# Patient Record
Sex: Male | Born: 1950 | Race: Black or African American | Hispanic: No | Marital: Married | Smoking: Never smoker
Health system: Southern US, Community
[De-identification: ages and names within clinical notes are randomized; demographics above are authoritative.]

## PROBLEM LIST (undated history)

## (undated) DIAGNOSIS — D332 Benign neoplasm of brain, unspecified: Secondary | ICD-10-CM

## (undated) DIAGNOSIS — G839 Paralytic syndrome, unspecified: Secondary | ICD-10-CM

---

## 2020-06-28 ENCOUNTER — Emergency Department (HOSPITAL_COMMUNITY): Payer: Medicare Other

## 2020-06-28 ENCOUNTER — Other Ambulatory Visit: Payer: Self-pay

## 2020-06-28 ENCOUNTER — Observation Stay (HOSPITAL_COMMUNITY)
Admission: EM | Admit: 2020-06-28 | Discharge: 2020-06-28 | Disposition: A | Discharge status: Home / Self Care | Payer: Medicare Other | Attending: Family Medicine | Admitting: Family Medicine

## 2020-06-28 ENCOUNTER — Encounter (HOSPITAL_COMMUNITY): Payer: Self-pay

## 2020-06-28 DIAGNOSIS — Z8673 Personal history of transient ischemic attack (TIA), and cerebral infarction without residual deficits: Secondary | ICD-10-CM | POA: Diagnosis not present

## 2020-06-28 DIAGNOSIS — Z7982 Long term (current) use of aspirin: Secondary | ICD-10-CM | POA: Insufficient documentation

## 2020-06-28 DIAGNOSIS — E041 Nontoxic single thyroid nodule: Secondary | ICD-10-CM | POA: Diagnosis not present

## 2020-06-28 DIAGNOSIS — E669 Obesity, unspecified: Secondary | ICD-10-CM | POA: Diagnosis present

## 2020-06-28 DIAGNOSIS — G459 Transient cerebral ischemic attack, unspecified: Secondary | ICD-10-CM | POA: Diagnosis present

## 2020-06-28 DIAGNOSIS — R29810 Facial weakness: Secondary | ICD-10-CM | POA: Diagnosis not present

## 2020-06-28 DIAGNOSIS — Z86011 Personal history of benign neoplasm of the brain: Secondary | ICD-10-CM | POA: Diagnosis not present

## 2020-06-28 DIAGNOSIS — R4781 Slurred speech: Secondary | ICD-10-CM | POA: Diagnosis not present

## 2020-06-28 DIAGNOSIS — M952 Other acquired deformity of head: Secondary | ICD-10-CM | POA: Diagnosis present

## 2020-06-28 DIAGNOSIS — E119 Type 2 diabetes mellitus without complications: Secondary | ICD-10-CM

## 2020-06-28 DIAGNOSIS — R55 Syncope and collapse: Secondary | ICD-10-CM

## 2020-06-28 DIAGNOSIS — Z20822 Contact with and (suspected) exposure to covid-19: Secondary | ICD-10-CM | POA: Diagnosis not present

## 2020-06-28 DIAGNOSIS — I10 Essential (primary) hypertension: Secondary | ICD-10-CM | POA: Diagnosis not present

## 2020-06-28 DIAGNOSIS — Z7984 Long term (current) use of oral hypoglycemic drugs: Secondary | ICD-10-CM | POA: Diagnosis not present

## 2020-06-28 DIAGNOSIS — N400 Enlarged prostate without lower urinary tract symptoms: Secondary | ICD-10-CM | POA: Diagnosis present

## 2020-06-28 DIAGNOSIS — Z79899 Other long term (current) drug therapy: Secondary | ICD-10-CM | POA: Diagnosis not present

## 2020-06-28 DIAGNOSIS — N529 Male erectile dysfunction, unspecified: Secondary | ICD-10-CM | POA: Diagnosis present

## 2020-06-28 DIAGNOSIS — I6529 Occlusion and stenosis of unspecified carotid artery: Secondary | ICD-10-CM | POA: Diagnosis present

## 2020-06-28 HISTORY — DX: Paralytic syndrome, unspecified: G83.9

## 2020-06-28 HISTORY — DX: Benign neoplasm of brain, unspecified: D33.2

## 2020-06-28 LAB — SARS CORONAVIRUS 2 BY RT PCR (HOSPITAL ORDER, PERFORMED IN ~~LOC~~ HOSPITAL LAB): SARS Coronavirus 2: NEGATIVE

## 2020-06-28 LAB — ETHANOL: Alcohol, Ethyl (B): <10 mg/dL (ref <10)

## 2020-06-28 LAB — RAPID URINE DRUG SCREEN, HOSP PERFORMED
Amphetamines: NOT DETECTED
Barbiturates: NOT DETECTED
Benzodiazepines: NOT DETECTED
Cocaine: NOT DETECTED
Opiates: NOT DETECTED
Tetrahydrocannabinol: NOT DETECTED

## 2020-06-28 LAB — COMPREHENSIVE METABOLIC PANEL
ALT: 25 U/L (ref 0–44)
AST: 23 U/L (ref 15–41)
Albumin: 4.3 g/dL (ref 3.5–5.0)
Alkaline Phosphatase: 50 U/L (ref 38–126)
Anion gap: 8 (ref 5–15)
BUN: 12 mg/dL (ref 8–23)
CO2: 20 mmol/L — ABNORMAL LOW (ref 22–32)
Calcium: 8.8 mg/dL — ABNORMAL LOW (ref 8.9–10.3)
Chloride: 111 mmol/L (ref 98–111)
Creatinine, Ser: 1.18 mg/dL (ref 0.61–1.24)
GFR calc Af Amer: >60 mL/min (ref >60)
GFR calc non Af Amer: >60 mL/min (ref >60)
Glucose, Bld: 110 mg/dL — ABNORMAL HIGH (ref 70–99)
Potassium: 3.8 mmol/L (ref 3.5–5.1)
Sodium: 139 mmol/L (ref 135–145)
Total Bilirubin: 0.3 mg/dL (ref 0.3–1.2)
Total Protein: 7.9 g/dL (ref 6.5–8.1)

## 2020-06-28 LAB — DIFFERENTIAL
Abs Immature Granulocytes: 0.03 10*3/uL (ref 0.00–0.07)
Basophils Absolute: 0 10*3/uL (ref 0.0–0.1)
Basophils Relative: 0 %
Eosinophils Absolute: 0.1 10*3/uL (ref 0.0–0.5)
Eosinophils Relative: 2 %
Immature Granulocytes: 1 %
Lymphocytes Relative: 25 %
Lymphs Abs: 1 10*3/uL (ref 0.7–4.0)
Monocytes Absolute: 0.5 10*3/uL (ref 0.1–1.0)
Monocytes Relative: 11 %
Neutro Abs: 2.5 10*3/uL (ref 1.7–7.7)
Neutrophils Relative %: 61 %

## 2020-06-28 LAB — URINALYSIS, ROUTINE W REFLEX MICROSCOPIC
Bilirubin Urine: NEGATIVE
Glucose, UA: NEGATIVE mg/dL
Hgb urine dipstick: NEGATIVE
Ketones, ur: NEGATIVE mg/dL
Leukocytes,Ua: NEGATIVE
Nitrite: NEGATIVE
Protein, ur: NEGATIVE mg/dL
Specific Gravity, Urine: 1.039 — ABNORMAL HIGH (ref 1.005–1.030)
pH: 6 (ref 5.0–8.0)

## 2020-06-28 LAB — APTT: aPTT: 28 seconds (ref 24–36)

## 2020-06-28 LAB — CBG MONITORING, ED: Glucose-Capillary: 113 mg/dL — ABNORMAL HIGH (ref 70–99)

## 2020-06-28 LAB — CBC
HCT: 36.3 % — ABNORMAL LOW (ref 39.0–52.0)
Hemoglobin: 11.5 g/dL — ABNORMAL LOW (ref 13.0–17.0)
MCH: 29.6 pg (ref 26.0–34.0)
MCHC: 31.7 g/dL (ref 30.0–36.0)
MCV: 93.6 fL (ref 80.0–100.0)
Platelets: 201 10*3/uL (ref 150–400)
RBC: 3.88 MIL/uL — ABNORMAL LOW (ref 4.22–5.81)
RDW: 14.1 % (ref 11.5–15.5)
WBC: 4.1 10*3/uL (ref 4.0–10.5)
nRBC: 0 % (ref 0.0–0.2)

## 2020-06-28 LAB — PROTIME-INR
INR: 1 (ref 0.8–1.2)
Prothrombin Time: 12.9 seconds (ref 11.4–15.2)

## 2020-06-28 MED ORDER — ASPIRIN 325 MG PO TABS
325.0000 mg | ORAL_TABLET | Freq: Once | ORAL | Status: AC
  Administered 2020-06-28: 325 mg via ORAL
  Filled 2020-06-28: qty 1

## 2020-06-28 MED ORDER — ASPIRIN EC 81 MG PO TBEC
81.0000 mg | DELAYED_RELEASE_TABLET | Freq: Every day | ORAL | 0 refills | Status: AC
Start: 2020-06-28 — End: 2021-06-28

## 2020-06-28 MED ORDER — IOHEXOL 350 MG/ML SOLN
75.0000 mL | Freq: Once | INTRAVENOUS | Status: AC | PRN
  Administered 2020-06-28: 75 mL via INTRAVENOUS

## 2020-06-28 MED ORDER — ATORVASTATIN CALCIUM 40 MG PO TABS
80.0000 mg | ORAL_TABLET | Freq: Every day | ORAL | Status: DC
  Administered 2020-06-28: 80 mg via ORAL
  Filled 2020-06-28: qty 2

## 2020-06-28 MED ORDER — ATORVASTATIN CALCIUM 80 MG PO TABS: 80.0000 mg | ORAL_TABLET | Freq: Every day | ORAL | 3 refills | Status: AC

## 2020-06-28 MED ORDER — CLOPIDOGREL BISULFATE 75 MG PO TABS
75.0000 mg | ORAL_TABLET | Freq: Once | ORAL | Status: AC
  Administered 2020-06-28: 75 mg via ORAL
  Filled 2020-06-28: qty 1

## 2020-06-28 NOTE — Consult Note (Addendum)
[facility] Tele[facility]rology Consult Services   Date of Service:   06/28/2020 14:28:30  Impression:     .  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 Patient back to baseline. CTA pending.  Metrics: Last Known Well: 06/28/2020 12:30:00 [facility] Notification Time: 06/28/2020 14:28:30 Arrival Time: 06/28/2020 14:04:00 Stamp Time: 06/28/2020 14:28:30 Time First Login Attempt: 06/28/2020 14:32:15 Symptoms: speech difficulty NIHSS Start Assessment Time: 06/28/2020 14:35:22 Patient is not a candidate for Thrombolytic. Thrombolytic Medical Decision: 06/28/2020 14:42:00 Patient was not deemed candidate for Thrombolytic because of following reasons: Patient back to his neurologic baseline. .  CT head was reviewed.  ED Physician notified of diagnostic impression and management plan on 06/28/2020 14:47:34  Advanced Imaging: CTA Head and Neck ordered   Our recommendations are outlined below.  Recommendations:     .  Activate Stroke Protocol Admission/Order Set     .  Stroke/Telemetry Floor     .  Neuro Checks     .  Bedside Swallow Eval     .  DVT Prophylaxis     .  IV Fluids, Normal Saline     .  Head of Bed 30 Degrees     .  Euglycemia and Avoid Hyperthermia (PRN Acetaminophen)     .  ASA and Plavix     .  Covid testing  Routine Consultation with [facility] for Follow up Care  Sign Out:     .  Discussed with Emergency Department Provider    ------------------------------------------------------------------------------  History of Present Illness: Patient is a 69 year old Male.  Patient was brought by private transportation with symptoms of speech difficulty  69 y/o with with h/o DM, HTN brain tumor s/p resection  (residual left face weakness and numbness.) Patient had transient speech difficulty 5-6 weeks ago and then again today since1230. Patient reported difficulty getting his words out. Emergent telestroke consult requested. CT brain reviewed and case discussed with ED attending. Patient says he feels back to normal at this time. Patient had his coronavirus vaccination. CTA pending IV access       Examination: BP(152/100), Pulse(83), Blood Glucose(113) 1A: Level of Consciousness - Alert; keenly responsive + 0 1B: Ask Month and Age - Both Questions Right + 0 1C: Blink Eyes & Squeeze Hands - Performs Both Tasks + 0 2: Test Horizontal Extraocular Movements - Normal + 0 3: Test Visual Fields - No Visual Loss + 0 4: Test Facial Palsy (Use Grimace if Obtunded) - Unilateral Complete paralysis (upper/lower face) + 3 (left face weakness - baseline) 5A: Test Left Arm Motor Drift - No Drift for 10 Seconds + 0 5B: Test Right Arm Motor Drift - No Drift for 10 Seconds + 0 6A: Test Left Leg Motor Drift - No Drift for 5 Seconds + 0 6B: Test Right Leg Motor Drift - No Drift for 5 Seconds + 0 7: Test Limb Ataxia (FNF/Heel-Shin) - No Ataxia + 0 8: Test Sensation - Mild-Moderate Loss: Less Sharp/More Dull + 1 (face numbness baseline) 9: Test Language/Aphasia - Normal; No aphasia + 0 10: Test Dysarthria - Normal + 0 11: Test Extinction/Inattention - No abnormality + 0  NIHSS Score: 4  Pre-Morbid Modified Rankin Scale: 0 Points = No symptoms at all   Patient/Family was informed the Neurology Consult would occur via  TeleHealth consult by way of interactive audio and video telecommunications and consented to receiving care in this manner.   Patient is being evaluated for possible acute neurologic impairment and high probability of imminent or life-threatening deterioration. I spent total of 20 minutes providing care to this patient, including time for face to face visit via telemedicine, review of medical  records, imaging studies and discussion of findings with providers, the patient and/or family.   Dr Meryl Crutch   [facility] [phone]  Case [id number]  Addendum 1507-  Advanced neuroimaging reviewed and discussed with the ED attending (Dr. Roderic Palau). No proximal intracranial LVO as per my review. Not a thrombectomy candidate. Formal radiology read pending and to be f/u by the ED attending. Please call with any questions.

## 2020-06-28 NOTE — ED Notes (Signed)
Pt brought in by family. Pt was at at funeral. Pt reports he noticed approx 1230-1300 he was having difficulty speaking and slurring. He had to read at apprx 1315 and noticed he was having to read, but had difficulty. At 1350 he attempted to speak and was unable to and was taken outside. By 2pm his speech had resolved, Family reports he had similar episode in April which resolved and he was never seen for . 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 Pt able to walk to back . No deficits noted

## 2020-06-28 NOTE — Consult Note (Signed)
Patient Demographics:    William Stone, is a 69 y.o. 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   BPH (benign prostatic hyperplasia)   ED (erectile dysfunction)  1)TiA--- appears recurrent, patient had similar speech disturbance apparently around Mother's Day and that this year  ----symptoms have completely resolved, patient remains at baseline according to his wife who is at bedside  ---CT head without contrast without acute finding -CTA head and neck with internal carotid artery stenosis measuring 60% on the right and over 50% on the left -Patient wants to defer transfer to [place] for brain MRI and further in-house neurology evaluation -Patient and wife agreeable to follow-up with PCP and neurology in [place] where patient is from to get an event monitor due to concerns about recurrent episodes of TIA and need to rule out underlying A. Fib/arrhythmia -- patient agrees to take take Aspirin 81 mg daily along with Plavix 75 mg daily for 30 days then after that STOP the aspirin and continue ONLY Plavix 75 mg daily indefinitely--for stroke prevention --Patient agrees to increase Lipitor to 80 mg from 40 mg daily---Even if his lipid panel is within desired limits, patient should still take Lipitor/Statin for it's Pleiotropic effects (beyond cholesterol lowering  benefits) -- -Patient is originally from [place] which is about 4 hours drive away-----patient and wife agreed/promised to follow-up with PCP for reevaluation and referral to neurology when they get back to [place], rather than go to [facility] today for brain MRI and further in-house neuro evaluation  2)Thyroid nodule on CTA neck finding--- outpatient thyroid ultrasound and TSH advised  3)DM2/HTN/BPH/Obesity-follow-up with PCP for ongoing management  Discharge Instructions:-  1)You have plaque buildup/atherosclerosis of the carotid arteries in your Neck-- Rt side is 60 % and Lt side is > 50%--this increases your risk for stroke and mini strokes so increase Lipitor /atorvastatin to 80 mg daily from 40 mg ---Please take Aspirin 81 mg daily along with Plavix 75 mg daily for 30 days then after that STOP the aspirin and continue ONLY Plavix 75 mg daily indefinitely--for stroke prevention --Please follow-up with neurology when you go back to [place] within the next week or 2--- --Please get copies of your CTA head and CTA neck report before you leave the emergency room today  2)Avoid ibuprofen/Advil/Aleve/Motrin/Goody Powders/Naproxen/BC powders/Meloxicam/Diclofenac/Indomethacin and other Nonsteroidal anti-inflammatory medications as these will make you more likely to bleed and can cause stomach ulcers, can also cause Kidney problems.  3) the CTA imaging study of your neck also reveals a thyroid nodule--you will need to get a thyroid ultrasound with your regular primary care physician as an outpatient -Your primary care physician may also consider a TSH blood test for your thyroid  4) you apparently had episode of speech disturbance and possible mini stroke according to you and your wife around Mother's Day----given recurrent episodes of speech disturbance will strongly recommend a 30-day event monitor to make sure you do not have irregular heartbeat condition like atrial  fibrillation which can increase your risk for stroke--treatment for atrial fibrillation will be slightly different if you found to have this condition   5) transient elevation of temperature---- -in ED concerns about possible temp of 100.4 in the ED, however rectal temp is only 99.3---- please note the patient already had [place] COVID-19 vaccination and patient has no respiratory or urinary symptoms  Disposition-- dc home   With History of - Reviewed by me  Past Medical History:  Diagnosis Date  . Brain tumor (benign) ([facility])   . 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 No pertinent surgical history.    Chief Complaint  Patient presents with  . Code Stroke      HPI:    William Stone  is a 69 y.o. male with past medical history remarkable for HTN, DM, HLD, BPH, erectile dysfunction and obesity and residual left-sided facial asymmetry from prior brain tumor removal years ago--presents to the ED accompanied by wife with concerns about speech disturbance that lasted over an hour --- pt was at at funeral. Pt reports he noticed approx 1230-1300 he was having difficulty speaking and slurring.  ---At 1350 he attempted to speak and was unable to and was taken outside. By 2pm his speech had resolved, Family reports he had similar episode on Mother's Day this year which resolved  --- In ED patient remains without new symptoms as per his wife back to baseline -in ED concerns about possible temp of 100.4 in the ED, however rectal temp is only 99.3---- please note the patient already had [place] COVID-19 vaccination and patient has no respiratory or urinary symptoms  --No dizziness, no chest pain no palpitations no syncopal type symptoms -In ED CT head without contrast without acute findings, CTA head and neck as noted above in assessment and plan -CBC and CMP without significant abnormalities except for mild anemia -- --Telemetry neurology consult noted  -Patient  is originally from [place] which is about 4 hours drive away-----patient and wife agreed/promised to follow-up with PCP for reevaluation and referral to neurology when they get back to [place], rather than go to [facility] today for brain MRI and further in-house neuro evaluation   Review of systems:    In addition to the HPI above,   A full Review of  Systems was done, all other systems reviewed are negative except as noted above in HPI , .    Social History:  Reviewed by me    Social History   Tobacco Use  . Smoking status: Never Smoker  Substance Use Topics  . Alcohol use: Not Currently     Family History :  Reviewed by me   History reviewed. No pertinent family history.   Home Medications:   Prior to Admission medications   Medication Sig Start Date End Date Taking? Authorizing Provider  alfuzosin (UROXATRAL) 10 MG 24 hr tablet Take 10 mg by mouth  daily. 03/30/20  Yes [provider]  cyanocobalamin (,VITAMIN B-12,) 1000 MCG/ML injection Inject 1 mL into the muscle every 30 (thirty) days. 12/30/19  Yes [provider]  finasteride (PROSCAR) 5 MG tablet Take 5 mg by mouth daily. 04/16/20  Yes [provider]  Glycerin-Hypromellose-PEG 400 (DRY EYE RELIEF DROPS) 0.2-0.2-1 % SOLN Apply 2 drops to eye daily.   Yes [provider]  metFORMIN (GLUCOPHAGE) 500 MG tablet Take 500 mg by mouth 2 (two) times daily with a meal.  06/20/20  Yes [provider]  metoprolol succinate (TOPROL-XL) 50 MG 24 hr tablet Take 1 tablet by mouth daily. 05/05/20  Yes [provider]  Multiple Vitamin (MULTIVITAMIN WITH MINERALS) TABS tablet Take 1 tablet by mouth daily.   Yes [provider]  pantoprazole (PROTONIX) 40 MG tablet Take 40 mg by mouth 2 (two) times daily. 04/25/20  Yes [provider]  sildenafil (VIAGRA) 100 MG tablet Take 100 mg by mouth as directed. 03/16/20  Yes [provider]  topiramate  (TOPAMAX) 100 MG tablet Take 1 tablet by mouth at bedtime. 06/24/20  Yes [provider]  aspirin EC 81 MG tablet Take 1 tablet (81 mg total) by mouth daily with breakfast. take Aspirin 81 mg daily along with Plavix 75 mg daily for 30 days then after that STOP the aspirin and continue ONLY Plavix 75 mg daily indefinitely--for stroke prevention 06/28/20 06/28/21  Roxan Hockey, MD  atorvastatin (LIPITOR) 80 MG tablet Take 1 tablet (80 mg total) by mouth daily. Increase Lipitor to 80 mg daily from 40 mg 06/28/20   Roxan Hockey, MD     Allergies:    No Known Allergies   Physical Exam:   Vitals  Blood pressure (!) 142/88, pulse 71, temperature 99.3 F (37.4 C), temperature source Rectal, resp. rate 21, height 5\' 10"  (1.778 m), weight (!) 112.1 kg, SpO2 100 %.  Physical Examination: General appearance - alert, obese appearing, and in no distress  Mental status - alert, oriented to person, place, and time,  Eyes - sclera anicteric Neck - supple, no JVD elevation , Chest - clear  to auscultation bilaterally, symmetrical air movement,  Heart - S1 and S2 normal, regular  Abdomen - soft, nontender, nondistended, no masses or organomegaly Neurological - screening mental status exam normal, neck supple without rigidity,  DTR's normal and symmetric, Lt sided Facial asymmetry as per patient and wife this is baseline, otherwise no new focal neuro deficits Extremities - no pedal edema noted, intact peripheral pulses  Skin - warm, dry     Data Review:    CBC Recent Labs  Lab 06/28/20 1427  WBC 4.1  HGB 11.5*  HCT 36.3*  PLT 201  MCV 93.6  MCH 29.6  MCHC 31.7  RDW 14.1  LYMPHSABS 1.0  MONOABS 0.5  EOSABS 0.1  BASOSABS 0.0   ------------------------------------------------------------------------------------------------------------------  Chemistries  Recent Labs  Lab 06/28/20 1427  NA 139  K 3.8  CL 111  CO2 20*  GLUCOSE 110*  BUN 12  CREATININE 1.18  CALCIUM  8.8*  AST 23  ALT 25  ALKPHOS 50  BILITOT 0.3   ------------------------------------------------------------------------------------------------------------------ estimated creatinine clearance is 76.1 mL/min (by C-G formula based on SCr of 1.18 mg/dL). ------------------------------------------------------------------------------------------------------------------ No results for input(s): TSH, T4TOTAL, T3FREE, THYROIDAB in the last 72 hours.  Invalid input(s): FREET3   Coagulation profile Recent Labs  Lab 06/28/20 1427  INR 1.0   ------------------------------------------------------------------------------------------------------------------- No results for input(s): DDIMER in the last  72 hours. -------------------------------------------------------------------------------------------------------------------  Cardiac Enzymes No results for input(s): CKMB, TROPONINI, MYOGLOBIN in the last 168 hours.  Invalid input(s): CK ------------------------------------------------------------------------------------------------------------------ No results found for: BNP   ---------------------------------------------------------------------------------------------------------------  Urinalysis    Component Value Date/Time   COLORURINE STRAW (A) 06/28/2020 1641   APPEARANCEUR CLEAR 06/28/2020 1641   LABSPEC 1.039 (H) 06/28/2020 1641   PHURINE 6.0 06/28/2020 1641   [place] 06/28/2020 1641   HGBUR NEGATIVE 06/28/2020 1641   BILIRUBINUR NEGATIVE 06/28/2020 1641   KETONESUR NEGATIVE 06/28/2020 1641   PROTEINUR NEGATIVE 06/28/2020 1641   NITRITE NEGATIVE 06/28/2020 1641   LEUKOCYTESUR NEGATIVE 06/28/2020 1641    ----------------------------------------------------------------------------------------------------------------   Imaging Results:    CT Angio Head W or Wo Contrast  Result Date: 06/28/2020 CLINICAL DATA:  Acute onset of slurred speech. EXAM: CT  ANGIOGRAPHY HEAD AND NECK TECHNIQUE: Multidetector CT imaging of the head and neck was performed using the standard protocol during bolus administration of intravenous contrast. Multiplanar CT image reconstructions and MIPs were obtained to evaluate the vascular anatomy. Carotid stenosis measurements (when applicable) are obtained utilizing NASCET criteria, using the distal internal carotid diameter as the denominator. CONTRAST:  42mL OMNIPAQUE IOHEXOL 350 MG/ML SOLN COMPARISON:  CT head without contrast 06/28/2020. FINDINGS: CTA NECK FINDINGS Aortic arch: The 3 vessel arch configuration is present. Atherosclerotic changes are noted at the origins of the left common carotid artery in the left subclavian artery without significant stenosis. Right carotid system: Right common carotid artery is within normal limits. Atherosclerotic calcifications are present along the medial aspect of the proximal right ICA without significant stenosis relative to the more distal vessel. Cervical right ICA is otherwise normal. Left carotid system: The left common carotid artery is within normal limits. A single punctate calcifications present at the proximal left ICA without significant stenosis. Cervical left ICA is otherwise normal. Vertebral arteries: The left vertebral artery is slightly dominant to the right. No significant stenosis is present at the origin of either vessel at the subclavian arteries. No significant stenosis present in the neck. Skeleton: Vertebral body heights and alignment are maintained. Mild degenerative changes are present in the cervical and thoracic spine. Foraminal narrowing is greatest at T1-2 in C7-T1, left greater than right. No focal lytic or blastic lesions are present. Other neck: Partially exophytic nodule at the lower pole of the left lobe of the thyroid measures 1.3 x 1.2 x 1.9 cm. No other focal thyroid lesions are present. No significant cervical adenopathy is present. Salivary glands are within  normal limits. Upper chest: The lung apices are clear. Thoracic inlet is within normal limits. Review of the MIP images confirms the above findings CTA HEAD FINDINGS Anterior circulation: Dense atherosclerotic calcifications are present within the cavernous internal carotid arteries bilaterally. Lumen is narrowed 1.3 mm on the left and 1.5 mm on the right this compares with normal measurements of 3.3 mm bilaterally. The A1 and M1 segments are normal. MCA bifurcations are within normal limits. The ACA and MCA branch vessels are within normal limits. Posterior circulation: The left vertebral artery is the dominant vessel. PICA origins are visualized and normal. The vertebrobasilar junction is normal. The basilar artery is normal. Both posterior cerebral arteries originate from basilar tip. PCA branch vessels are within normal limits bilaterally. Venous sinuses: Dural sinuses patent. The straight sinus and deep cerebral veins are intact. Cortical veins are within normal limits. The left transverse sinus is dominant. Anatomic variants: None Review of the MIP images confirms the above findings IMPRESSION: 1. No  emergent large vessel occlusion. 2. Atherosclerotic changes at the carotid bifurcations bilaterally without significant stenosis relative to the more distal vessels. 3. 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 Stenosis measures 60% on the right and just over 50% on the left. 4. No other significant proximal stenosis, aneurysm, or branch vessel occlusion within the Circle of [place]. 5. 1.9 cm left lower pole thyroid nodule. Recommend thyroid US (ref: J Am Coll Radiol. 2015 Feb;12(2): 143-50). Electronically Signed   By: San Morelle M.D.   On: 06/28/2020 15:20   CT Angio Neck W and/or Wo Contrast  Result Date: 06/28/2020 CLINICAL DATA:  Acute onset of slurred speech. EXAM: CT ANGIOGRAPHY HEAD AND NECK TECHNIQUE: Multidetector CT imaging of the head and neck was  performed using the standard protocol during bolus administration of intravenous contrast. Multiplanar CT image reconstructions and MIPs were obtained to evaluate the vascular anatomy. Carotid stenosis measurements (when applicable) are obtained utilizing NASCET criteria, using the distal internal carotid diameter as the denominator. CONTRAST:  83mL OMNIPAQUE IOHEXOL 350 MG/ML SOLN COMPARISON:  CT head without contrast 06/28/2020. FINDINGS: CTA NECK FINDINGS Aortic arch: The 3 vessel arch configuration is present. Atherosclerotic changes are noted at the origins of the left common carotid artery in the left subclavian artery without significant stenosis. Right carotid system: Right common carotid artery is within normal limits. Atherosclerotic calcifications are present along the medial aspect of the proximal right ICA without significant stenosis relative to the more distal vessel. Cervical right ICA is otherwise normal. Left carotid system: The left common carotid artery is within normal limits. A single punctate calcifications present at the proximal left ICA without significant stenosis. Cervical left ICA is otherwise normal. Vertebral arteries: The left vertebral artery is slightly dominant to the right. No significant stenosis is present at the origin of either vessel at the subclavian arteries. No significant stenosis present in the neck. Skeleton: Vertebral body heights and alignment are maintained. Mild degenerative changes are present in the cervical and thoracic spine. Foraminal narrowing is greatest at T1-2 in C7-T1, left greater than right. No focal lytic or blastic lesions are present. Other neck: Partially exophytic nodule at the lower pole of the left lobe of the thyroid measures 1.3 x 1.2 x 1.9 cm. No other focal thyroid lesions are present. No significant cervical adenopathy is present. Salivary glands are within normal limits. Upper chest: The lung apices are clear. Thoracic inlet is within normal  limits. Review of the MIP images confirms the above findings CTA HEAD FINDINGS Anterior circulation: Dense atherosclerotic calcifications are present within the cavernous internal carotid arteries bilaterally. Lumen is narrowed 1.3 mm on the left and 1.5 mm on the right this compares with normal measurements of 3.3 mm bilaterally. The A1 and M1 segments are normal. MCA bifurcations are within normal limits. The ACA and MCA branch vessels are within normal limits. Posterior circulation: The left vertebral artery is the dominant vessel. PICA origins are visualized and normal. The vertebrobasilar junction is normal. The basilar artery is normal. Both posterior cerebral arteries originate from basilar tip. PCA branch vessels are within normal limits bilaterally. Venous sinuses: Dural sinuses patent. The straight sinus and deep cerebral veins are intact. Cortical veins are within normal limits. The left transverse sinus is dominant. Anatomic variants: None Review of the MIP images confirms the above findings IMPRESSION: 1. No emergent large vessel occlusion. 2. Atherosclerotic changes at the carotid bifurcations bilaterally without significant stenosis relative to the more distal vessels. 3. Atherosclerotic  changes within the cavernous internal carotid arteries bilaterally. Stenosis measures 60% on the right and just over 50% on the left. 4. No other significant proximal stenosis, aneurysm, or branch vessel occlusion within the Circle of [place]. 5. 1.9 cm left lower pole thyroid nodule. Recommend thyroid US (ref: J Am Coll Radiol. 2015 Feb;12(2): 143-50). Electronically Signed   By: San Morelle M.D.   On: 06/28/2020 15:20   CT HEAD CODE STROKE WO CONTRAST  Result Date: 06/28/2020 CLINICAL DATA:  Code stroke.  Slurred speech. EXAM: CT HEAD WITHOUT CONTRAST TECHNIQUE: Contiguous axial images were obtained from the base of the skull through the vertex without intravenous contrast. COMPARISON:  None. FINDINGS:  Brain: There is no evidence of acute infarct, intracranial hemorrhage, mass, midline shift, or extra-axial fluid collection. The ventricles and sulci are normal. Vascular: Calcified atherosclerosis at the skull base. No hyperdense vessel. Skull: No fracture or suspicious osseous lesion. Sinuses/Orbits: Left canal wall up mastoidectomy. Mild right maxillary sinus mucosal thickening. Left eyelid weight. Other: None. ASPECTS [facility] Stroke Program Early CT Score) - Ganglionic level infarction (caudate, lentiform nuclei, internal capsule, insula, M1-M3 cortex): 7 - Supraganglionic infarction (M4-M6 cortex): 3 Total score (0-10 with 10 being normal): 10 IMPRESSION: 1. No evidence of acute intracranial abnormality. 2. ASPECTS is 10. These results were called by telephone at the time of interpretation on 06/28/2020 at 2:29 pm to Dr. Roderic Palau, who verbally acknowledged these results. Electronically Signed   By: Logan Bores M.D.   On: 06/28/2020 14:30    Radiological Exams on Admission: CT Angio Head W or Wo Contrast  Result Date: 06/28/2020 CLINICAL DATA:  Acute onset of slurred speech. EXAM: CT ANGIOGRAPHY HEAD AND NECK TECHNIQUE: Multidetector CT imaging of the head and neck was performed using the standard protocol during bolus administration of intravenous contrast. Multiplanar CT image reconstructions and MIPs were obtained to evaluate the vascular anatomy. Carotid stenosis measurements (when applicable) are obtained utilizing NASCET criteria, using the distal internal carotid diameter as the denominator. CONTRAST:  26mL OMNIPAQUE IOHEXOL 350 MG/ML SOLN COMPARISON:  CT head without contrast 06/28/2020. FINDINGS: CTA NECK FINDINGS Aortic arch: The 3 vessel arch configuration is present. Atherosclerotic changes are noted at the origins of the left common carotid artery in the left subclavian artery without significant stenosis. Right carotid system: Right common carotid artery is within normal limits.  Atherosclerotic calcifications are present along the medial aspect of the proximal right ICA without significant stenosis relative to the more distal vessel. Cervical right ICA is otherwise normal. Left carotid system: The left common carotid artery is within normal limits. A single punctate calcifications present at the proximal left ICA without significant stenosis. Cervical left ICA is otherwise normal. Vertebral arteries: The left vertebral artery is slightly dominant to the right. No significant stenosis is present at the origin of either vessel at the subclavian arteries. No significant stenosis present in the neck. Skeleton: Vertebral body heights and alignment are maintained. Mild degenerative changes are present in the cervical and thoracic spine. Foraminal narrowing is greatest at T1-2 in C7-T1, left greater than right. No focal lytic or blastic lesions are present. Other neck: Partially exophytic nodule at the lower pole of the left lobe of the thyroid measures 1.3 x 1.2 x 1.9 cm. No other focal thyroid lesions are present. No significant cervical adenopathy is present. Salivary glands are within normal limits. Upper chest: The lung apices are clear. Thoracic inlet is within normal limits. Review of the MIP images confirms the above  findings CTA HEAD FINDINGS Anterior circulation: Dense atherosclerotic calcifications are present within the cavernous internal carotid arteries bilaterally. Lumen is narrowed 1.3 mm on the left and 1.5 mm on the right this compares with normal measurements of 3.3 mm bilaterally. The A1 and M1 segments are normal. MCA bifurcations are within normal limits. The ACA and MCA branch vessels are within normal limits. Posterior circulation: The left vertebral artery is the dominant vessel. PICA origins are visualized and normal. The vertebrobasilar junction is normal. The basilar artery is normal. Both posterior cerebral arteries originate from basilar tip. PCA branch vessels are  within normal limits bilaterally. Venous sinuses: Dural sinuses patent. The straight sinus and deep cerebral veins are intact. Cortical veins are within normal limits. The left transverse sinus is dominant. Anatomic variants: None Review of the MIP images confirms the above findings IMPRESSION: 1. No emergent large vessel occlusion. 2. Atherosclerotic changes at the carotid bifurcations bilaterally without significant stenosis relative to the more distal vessels. 3. 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 Stenosis measures 60% on the right and just over 50% on the left. 4. No other significant proximal stenosis, aneurysm, or branch vessel occlusion within the Circle of [place]. 5. 1.9 cm left lower pole thyroid nodule. Recommend thyroid US (ref: J Am Coll Radiol. 2015 Feb;12(2): 143-50). Electronically Signed   By: San Morelle M.D.   On: 06/28/2020 15:20   CT Angio Neck W and/or Wo Contrast  Result Date: 06/28/2020 CLINICAL DATA:  Acute onset of slurred speech. EXAM: CT ANGIOGRAPHY HEAD AND NECK TECHNIQUE: Multidetector CT imaging of the head and neck was performed using the standard protocol during bolus administration of intravenous contrast. Multiplanar CT image reconstructions and MIPs were obtained to evaluate the vascular anatomy. Carotid stenosis measurements (when applicable) are obtained utilizing NASCET criteria, using the distal internal carotid diameter as the denominator. CONTRAST:  67mL OMNIPAQUE IOHEXOL 350 MG/ML SOLN COMPARISON:  CT head without contrast 06/28/2020. FINDINGS: CTA NECK FINDINGS Aortic arch: The 3 vessel arch configuration is present. Atherosclerotic changes are noted at the origins of the left common carotid artery in the left subclavian artery without significant stenosis. Right carotid system: Right common carotid artery is within normal limits. Atherosclerotic calcifications are present along the medial aspect of the proximal  right ICA without significant stenosis relative to the more distal vessel. Cervical right ICA is otherwise normal. Left carotid system: The left common carotid artery is within normal limits. A single punctate calcifications present at the proximal left ICA without significant stenosis. Cervical left ICA is otherwise normal. Vertebral arteries: The left vertebral artery is slightly dominant to the right. No significant stenosis is present at the origin of either vessel at the subclavian arteries. No significant stenosis present in the neck. Skeleton: Vertebral body heights and alignment are maintained. Mild degenerative changes are present in the cervical and thoracic spine. Foraminal narrowing is greatest at T1-2 in C7-T1, left greater than right. No focal lytic or blastic lesions are present. Other neck: Partially exophytic nodule at the lower pole of the left lobe of the thyroid measures 1.3 x 1.2 x 1.9 cm. No other focal thyroid lesions are present. No significant cervical adenopathy is present. Salivary glands are within normal limits. Upper chest: The lung apices are clear. Thoracic inlet is within normal limits. Review of the MIP images confirms the above findings CTA HEAD FINDINGS Anterior circulation: Dense atherosclerotic calcifications are present within the cavernous internal carotid arteries bilaterally. Lumen is narrowed 1.3 mm  on the left and 1.5 mm on the right this compares with normal measurements of 3.3 mm bilaterally. The A1 and M1 segments are normal. MCA bifurcations are within normal limits. The ACA and MCA branch vessels are within normal limits. Posterior circulation: The left vertebral artery is the dominant vessel. PICA origins are visualized and normal. The vertebrobasilar junction is normal. The basilar artery is normal. Both posterior cerebral arteries originate from basilar tip. PCA branch vessels are within normal limits bilaterally. Venous sinuses: Dural sinuses patent. The straight  sinus and deep cerebral veins are intact. Cortical veins are within normal limits. The left transverse sinus is dominant. Anatomic variants: None Review of the MIP images confirms the above findings IMPRESSION: 1. No emergent large vessel occlusion. 2. Atherosclerotic changes at the carotid bifurcations bilaterally without significant stenosis relative to the more distal vessels. 3. 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 Stenosis measures 60% on the right and just over 50% on the left. 4. No other significant proximal stenosis, aneurysm, or branch vessel occlusion within the [place]. 5. 1.9 cm left lower pole thyroid nodule. Recommend thyroid US (ref: J Am Coll Radiol. 2015 Feb;12(2): 143-50). Electronically Signed   By: San Morelle M.D.   On: 06/28/2020 15:20   CT HEAD CODE STROKE WO CONTRAST  Result Date: 06/28/2020 CLINICAL DATA:  Code stroke.  Slurred speech. EXAM: CT HEAD WITHOUT CONTRAST TECHNIQUE: Contiguous axial images were obtained from the base of the skull through the vertex without intravenous contrast. COMPARISON:  None. FINDINGS: Brain: There is no evidence of acute infarct, intracranial hemorrhage, mass, midline shift, or extra-axial fluid collection. The ventricles and sulci are normal. Vascular: Calcified atherosclerosis at the skull base. No hyperdense vessel. Skull: No fracture or suspicious osseous lesion. Sinuses/Orbits: Left canal wall up mastoidectomy. Mild right maxillary sinus mucosal thickening. Left eyelid weight. Other: None. ASPECTS [facility] Stroke Program Early CT Score) - Ganglionic level infarction (caudate, lentiform nuclei, internal capsule, insula, M1-M3 cortex): 7 - Supraganglionic infarction (M4-M6 cortex): 3 Total score (0-10 with 10 being normal): 10 IMPRESSION: 1. No evidence of acute intracranial abnormality. 2. ASPECTS is 10. These results were called by telephone at the time of interpretation on 06/28/2020  at 2:29 pm to Dr. Roderic Palau, who verbally acknowledged these results. Electronically Signed   By: Logan Bores M.D.   On: 06/28/2020 14:30   Family Communication: Admission, patients condition and plan of care including tests being ordered have been discussed with the patient and wife who indicate understanding and agree with the plan   Code Status - Full Code  Likely DC to  home  Condition   stable  Roxan Hockey M.D on 06/28/2020 at 5:44 PM Go to [email or web address] -  for contact info  [facility] - Office  [phone]

## 2020-06-28 NOTE — ED Notes (Signed)
Pt has elected to go to [place] to see his neuro there   He had a long discussion with Dr Joesph Fillers   He has no deficits and is accompanied by his spouse

## 2020-06-28 NOTE — ED Provider Notes (Addendum)
[facility] EMERGENCY DEPARTMENT Provider Note   CSN: [id number] Arrival date & time: 06/28/20  1404  An emergency department physician performed an initial assessment on this suspected stroke patient at 1415.  History Chief Complaint  Patient presents with   Code Stroke    William Stone is a 69 y.o. male past history of brain tumor (left-sided facial paralysis deficit noted) who presents for evaluation of slurred speech.  He thinks that this may have occurred about 2-2 1/2 hours ago.  He states that he was at his aunt's funeral and was talking and ministers and felt felt like he could not get the words that he wanted to say out correctly.  He states that he was able to read scripture at the funeral.  He states that he continued to talk and his wife noticed some speech difficulty as well and noticed that he was slurred speech.  He did feel like he was still having trouble getting his words out.  He states that this is since resolved and on ED arrival, he feels like it is completely gone.  He states that this is happened one other time about a month ago.  He did not seek evaluation at that time.  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  He states during this time, he had no history of chest pain, difficulty breathing, numbness/weakness of his arms or legs, vision changes.  He does not smoke and denies any alcohol use.  He has history of prediabetes.  No history of hypertension.   The history is provided by the patient.       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 No pertinent surgical history.     History reviewed. No pertinent family history.  Social History   Tobacco Use   Smoking status: Never Smoker  Substance Use Topics   Alcohol use: Not Currently   Drug use: Not Currently    Home Medications Prior to Admission medications   Medication Sig Start Date End Date Taking? Authorizing Provider  alfuzosin (UROXATRAL) 10 MG 24 hr tablet Take 10 mg by mouth daily. 03/30/20  Yes [provider]  cyanocobalamin (,VITAMIN B-12,) 1000 MCG/ML injection Inject 1 mL into the muscle every 30 (thirty) days. 12/30/19  Yes [provider]  finasteride (PROSCAR) 5 MG tablet Take 5 mg by mouth daily. 04/16/20  Yes [provider]  Glycerin-Hypromellose-PEG 400 (DRY EYE RELIEF DROPS) 0.2-0.2-1 % SOLN Apply 2 drops to eye daily.   Yes [provider]  metFORMIN (GLUCOPHAGE) 500 MG tablet Take 500 mg by mouth 2 (two) times daily with a meal.  06/20/20  Yes [provider]  metoprolol succinate (TOPROL-XL) 50 MG 24 hr tablet Take 1 tablet by mouth daily. 05/05/20  Yes [provider]  Multiple Vitamin (MULTIVITAMIN WITH MINERALS) TABS tablet Take 1 tablet by mouth daily.   Yes [provider]  pantoprazole (PROTONIX) 40 MG tablet Take 40 mg by mouth 2 (two) times daily. 04/25/20  Yes [provider]  sildenafil (VIAGRA) 100 MG tablet Take 100 mg by mouth as directed. 03/16/20  Yes [provider]  topiramate (TOPAMAX) 100 MG tablet Take 1 tablet by mouth at bedtime. 06/24/20  Yes [provider]  aspirin EC 81 MG tablet Take 1 tablet (81 mg total) by mouth daily with breakfast. take Aspirin 81 mg daily along with  Plavix 75 mg daily for 30 days then after that STOP the aspirin and continue ONLY Plavix 75 mg daily indefinitely--for stroke prevention 06/28/20 06/28/21  Roxan Hockey, MD  atorvastatin (LIPITOR) 80 MG tablet Take 1 tablet (80 mg total) by mouth daily. Increase Lipitor to 80 mg daily from 40 mg 06/28/20   Roxan Hockey, MD    Allergies    Patient has no known allergies.  Review of Systems   Review of Systems  Constitutional: Negative for fever.  Respiratory: Negative for cough and shortness of breath.   Cardiovascular: Negative for chest pain.  Gastrointestinal: Negative for abdominal pain, nausea and vomiting.  Genitourinary: Negative for dysuria and hematuria.  Neurological: Positive for speech difficulty. Negative for weakness, numbness and headaches.  All other systems reviewed and are negative.   Physical Exam Updated Vital Signs BP (!) 142/88    Pulse 71    Temp 99.3 F (37.4 C) (Rectal)    Resp 21    Ht 5\' 10"  (1.778 m)    Wt (!) 112.1 kg    SpO2 100%    BMI 35.47 kg/m   Physical Exam Vitals and nursing note reviewed.  Constitutional:      Appearance: Normal appearance. He is well-developed.  HENT:     Head: Normocephalic and atraumatic.  Eyes:     General: Lids are normal.     Conjunctiva/sclera: Conjunctivae normal.     Pupils: Pupils are equal, round, and reactive to light.  Cardiovascular:     Rate and Rhythm: Normal rate and regular rhythm.     Pulses: Normal pulses.     Heart sounds: Normal heart sounds. No murmur heard.  No friction rub. No gallop.   Pulmonary:     Effort: Pulmonary effort is normal.     Breath sounds: Normal breath sounds.     Comments: Lungs clear to auscultation bilaterally.  Symmetric chest rise.  No wheezing, rales, rhonchi. Abdominal:     Palpations: Abdomen is soft. Abdomen is not rigid.     Tenderness: There is no abdominal tenderness. There is no guarding.     Comments: Abdomen is soft, non-distended, non-tender. No rigidity,  No guarding. No peritoneal signs.  Musculoskeletal:        General: Normal range of motion.     Cervical back: Full passive range of motion without pain.  Skin:    General: Skin is warm and dry.     Capillary Refill: Capillary refill takes less than 2 seconds.  Neurological:     Mental Status: He is alert and oriented to person, place, and time.     Cranial Nerves: Facial asymmetry present.     Comments: Left-sided facial droop noted.  He has paralysis of the left cranial nerves secondary to pre-existing baseline. No slurred speech noted. Follows commands, Moves all extremities  5/5 strength to BUE and BLE  Sensation intact  throughout all major nerve distributions Normal finger to nose. No dysdiadochokinesia. No pronator drift. No gait abnormalities   Psychiatric:        Speech: Speech normal.     ED Results / Procedures / Treatments   Labs (all labs ordered are listed, but only abnormal results are displayed) Labs Reviewed  CBC - Abnormal; Notable for the following components:      Result Value   RBC 3.88 (*)    Hemoglobin 11.5 (*)    HCT 36.3 (*)    All other components within normal limits  COMPREHENSIVE METABOLIC PANEL - Abnormal; Notable for the following components:   CO2 20 (*)    Glucose, Bld 110 (*)    Calcium 8.8 (*)    All other components within normal limits  URINALYSIS, ROUTINE W REFLEX MICROSCOPIC - Abnormal; Notable for the following components:   Color, Urine STRAW (*)    Specific Gravity, Urine 1.039 (*)    All other components within normal limits  CBG MONITORING, ED - Abnormal; Notable for the following components:   Glucose-Capillary 113 (*)    All other components within normal limits  SARS CORONAVIRUS 2 BY RT PCR (HOSPITAL ORDER, [place])  ETHANOL  PROTIME-INR  APTT  DIFFERENTIAL  RAPID URINE DRUG SCREEN, HOSP PERFORMED  I-STAT CHEM 8, ED    EKG None  Radiology CT Angio Head W or Wo Contrast  Result  Date: 06/28/2020 CLINICAL DATA:  Acute onset of slurred speech. EXAM: CT ANGIOGRAPHY HEAD AND NECK TECHNIQUE: Multidetector CT imaging of the head and neck was performed using the standard protocol during bolus administration of intravenous contrast. Multiplanar CT image reconstructions and MIPs were obtained to evaluate the vascular anatomy. Carotid stenosis measurements (when applicable) are obtained utilizing NASCET criteria, using the distal internal carotid diameter as the denominator. CONTRAST:  31mL OMNIPAQUE IOHEXOL 350 MG/ML SOLN COMPARISON:  CT head without contrast 06/28/2020. FINDINGS: CTA NECK FINDINGS Aortic arch: The 3 vessel arch configuration is present. Atherosclerotic changes are noted at the origins of the left common carotid artery in the left subclavian artery without significant stenosis. Right carotid system: Right common carotid artery is within normal limits. Atherosclerotic calcifications are present along the medial aspect of the proximal right ICA without significant stenosis relative to the more distal vessel. Cervical right ICA is otherwise normal. Left carotid system: The left common carotid artery is within normal limits. A single punctate calcifications present at the proximal left ICA without significant stenosis. Cervical left ICA is otherwise normal. Vertebral arteries: The left vertebral artery is slightly dominant to the right. No significant stenosis is present at the origin of either vessel at the subclavian arteries. No significant stenosis present in the neck. Skeleton: Vertebral body heights and alignment are maintained. Mild degenerative changes are present in the cervical and thoracic spine. Foraminal narrowing is greatest at T1-2 in C7-T1, left greater than right. No focal lytic or blastic lesions are present. Other neck: Partially exophytic nodule at the lower pole of the left lobe of the thyroid measures 1.3 x 1.2 x 1.9 cm. No other focal thyroid lesions are present.  No significant cervical adenopathy is present. Salivary glands are within normal limits. Upper chest: The lung apices are clear. Thoracic inlet is within normal limits. Review of the MIP images confirms the above findings CTA HEAD FINDINGS Anterior circulation: Dense atherosclerotic calcifications are present within the cavernous internal carotid arteries bilaterally. Lumen is narrowed 1.3 mm on the left  and 1.5 mm on the right this compares with normal measurements of 3.3 mm bilaterally. The A1 and M1 segments are normal. MCA bifurcations are within normal limits. The ACA and MCA branch vessels are within normal limits. Posterior circulation: The left vertebral artery is the dominant vessel. PICA origins are visualized and normal. The vertebrobasilar junction is normal. The basilar artery is normal. Both posterior cerebral arteries originate from basilar tip. PCA branch vessels are within normal limits bilaterally. Venous sinuses: Dural sinuses patent. The straight sinus and deep cerebral veins are intact. Cortical veins are within normal limits. The left transverse sinus is dominant. Anatomic variants: None Review of the MIP images confirms the above findings IMPRESSION: 1. No emergent large vessel occlusion. 2. Atherosclerotic changes at the carotid bifurcations bilaterally without significant stenosis relative to the more distal vessels. 3. 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 Stenosis measures 60% on the right and just over 50% on the left. 4. No other significant proximal stenosis, aneurysm, or branch vessel occlusion within the Circle of [place]. 5. 1.9 cm left lower pole thyroid nodule. Recommend thyroid US (ref: J Am Coll Radiol. 2015 Feb;12(2): 143-50). Electronically Signed   By: San Morelle M.D.   On: 06/28/2020 15:20   CT Angio Neck W and/or Wo Contrast  Result Date: 06/28/2020 CLINICAL DATA:  Acute onset of slurred speech. EXAM: CT ANGIOGRAPHY  HEAD AND NECK TECHNIQUE: Multidetector CT imaging of the head and neck was performed using the standard protocol during bolus administration of intravenous contrast. Multiplanar CT image reconstructions and MIPs were obtained to evaluate the vascular anatomy. Carotid stenosis measurements (when applicable) are obtained utilizing NASCET criteria, using the distal internal carotid diameter as the denominator. CONTRAST:  21mL OMNIPAQUE IOHEXOL 350 MG/ML SOLN COMPARISON:  CT head without contrast 06/28/2020. FINDINGS: CTA NECK FINDINGS Aortic arch: The 3 vessel arch configuration is present. Atherosclerotic changes are noted at the origins of the left common carotid artery in the left subclavian artery without significant stenosis. Right carotid system: Right common carotid artery is within normal limits. Atherosclerotic calcifications are present along the medial aspect of the proximal right ICA without significant stenosis relative to the more distal vessel. Cervical right ICA is otherwise normal. Left carotid system: The left common carotid artery is within normal limits. A single punctate calcifications present at the proximal left ICA without significant stenosis. Cervical left ICA is otherwise normal. Vertebral arteries: The left vertebral artery is slightly dominant to the right. No significant stenosis is present at the origin of either vessel at the subclavian arteries. No significant stenosis present in the neck. Skeleton: Vertebral body heights and alignment are maintained. Mild degenerative changes are present in the cervical and thoracic spine. Foraminal narrowing is greatest at T1-2 in C7-T1, left greater than right. No focal lytic or blastic lesions are present. Other neck: Partially exophytic nodule at the lower pole of the left lobe of the thyroid measures 1.3 x 1.2 x 1.9 cm. No other focal thyroid lesions are present. No significant cervical adenopathy is present. Salivary glands are within normal  limits. Upper chest: The lung apices are clear. Thoracic inlet is within normal limits. Review of the MIP images confirms the above findings CTA HEAD FINDINGS Anterior circulation: Dense atherosclerotic calcifications are present within the cavernous internal carotid arteries bilaterally. Lumen is narrowed 1.3 mm on the left and 1.5 mm on the right this compares with normal measurements of 3.3 mm bilaterally. The A1 and M1 segments are normal. MCA  bifurcations are within normal limits. The ACA and MCA branch vessels are within normal limits. Posterior circulation: The left vertebral artery is the dominant vessel. PICA origins are visualized and normal. The vertebrobasilar junction is normal. The basilar artery is normal. Both posterior cerebral arteries originate from basilar tip. PCA branch vessels are within normal limits bilaterally. Venous sinuses: Dural sinuses patent. The straight sinus and deep cerebral veins are intact. Cortical veins are within normal limits. The left transverse sinus is dominant. Anatomic variants: None Review of the MIP images confirms the above findings IMPRESSION: 1. No emergent large vessel occlusion. 2. Atherosclerotic changes at the carotid bifurcations bilaterally without significant stenosis relative to the more distal vessels. 3. 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 Stenosis measures 60% on the right and just over 50% on the left. 4. No other significant proximal stenosis, aneurysm, or branch vessel occlusion within the Circle of [place]. 5. 1.9 cm left lower pole thyroid nodule. Recommend thyroid US (ref: J Am Coll Radiol. 2015 Feb;12(2): 143-50). Electronically Signed   By: San Morelle M.D.   On: 06/28/2020 15:20   CT HEAD CODE STROKE WO CONTRAST  Result Date: 06/28/2020 CLINICAL DATA:  Code stroke.  Slurred speech. EXAM: CT HEAD WITHOUT CONTRAST TECHNIQUE: Contiguous axial images were obtained from the base of the skull  through the vertex without intravenous contrast. COMPARISON:  None. FINDINGS: Brain: There is no evidence of acute infarct, intracranial hemorrhage, mass, midline shift, or extra-axial fluid collection. The ventricles and sulci are normal. Vascular: Calcified atherosclerosis at the skull base. No hyperdense vessel. Skull: No fracture or suspicious osseous lesion. Sinuses/Orbits: Left canal wall up mastoidectomy. Mild right maxillary sinus mucosal thickening. Left eyelid weight. Other: None. ASPECTS [facility] Stroke Program Early CT Score) - Ganglionic level infarction (caudate, lentiform nuclei, internal capsule, insula, M1-M3 cortex): 7 - Supraganglionic infarction (M4-M6 cortex): 3 Total score (0-10 with 10 being normal): 10 IMPRESSION: 1. No evidence of acute intracranial abnormality. 2. ASPECTS is 10. These results were called by telephone at the time of interpretation on 06/28/2020 at 2:29 pm to Dr. Roderic Palau, who verbally acknowledged these results. Electronically Signed   By: Logan Bores M.D.   On: 06/28/2020 14:30    Procedures Procedures (including critical care time)  Medications Ordered in ED Medications  atorvastatin (LIPITOR) tablet 80 mg (80 mg Oral Given 06/28/20 1638)  iohexol (OMNIPAQUE) 350 MG/ML injection 75 mL (75 mLs Intravenous Contrast Given 06/28/20 1451)  aspirin tablet 325 mg (325 mg Oral Given 06/28/20 1637)  clopidogrel (PLAVIX) tablet 75 mg (75 mg Oral Given 06/28/20 1705)    ED Course  I have reviewed the triage vital signs and the nursing notes.  Pertinent labs & imaging results that were available during my care of the patient were reviewed by me and considered in my medical decision making (see chart for details).    MDM Rules/Calculators/A&P                          69 year old male who presents for evaluation of slurred speech that began about 2 hours prior to ED arrival.  He reports that he was at his aunt's funeral and this started.  He felt like he could not get  his words out.  Wife also noticed some speech difficulty.  He states that this is been improved by the time he got to the ED, it is resolved.  On initially arrival, he is slightly febrile 100.4.  This was done orally. His repeat rectal temperature was 99.3.  He has not had any cough, infectious symptoms. Vitals otherwise stable.  On exam, no evidence of slurred speech.  He can clearly recite no ifs, ands or buts and you can teach an old dog new tricks without any signs of speech deficits.  He does have left-sided facial paralysis which is his baseline from previous brain surgery.  No other neuro deficits noted on exam.  Code stroke was initiated by ED attending.  CMP shows normal BUN and creatinine.  INR is 1.0.  CBC shows no leukocytosis.  Hemoglobin stable at 11.5.  CT head shows no evidence of acute intracranial abnormality.  CTA head and neck shows no evidence of large vessel occlusion.  He has atherosclerotic changes of the carotid bifurcations bilaterally without significant stenosis.  He also has atherosclerotic changes with the cavernous internal carotid arteries bilaterally with stenosis measuring 60% and 50% on the left.  At this time, concern about recurrent TIA given he had an episode of symptoms 6 weeks ago.  At this time, do not suspect infectious source as he has no leukocytosis.  His rectal temperature here was 99.3.  Dr. Gaye Pollack (Neurology) discussed with Dr. Roderic Palau who recommends admission for stroke work-up and MRI.  Discussed patient with Dr. Denton Brick (hospitalist) who accepts patient for admission.   Discussed patient with Dr. Denton Brick (hospitalist) after evaluated.  Patient was going to be admitted for stroke work-up which would include MRI.  Patient not to be transferred to [facility] in [place].  Patient initially implied to me that he would be agreeable to this but after discussion, he wished not to be admitted.  Dr. Denton Brick will discharge with aspirin, Lipitor.  Please see his  note.  Portions of this note were generated with [employer]. Dictation errors may occur despite best attempts at proofreading.  Final Clinical Impression(s) / ED Diagnoses Final diagnoses:  Slurred speech    Rx / DC Orders ED Discharge Orders         Ordered    atorvastatin (LIPITOR) 80 MG tablet  Daily     Discontinue  Reprint     06/28/20 1721    aspirin EC 81 MG tablet  Daily with breakfast     Discontinue  Reprint     06/28/20 1721    Increase activity slowly     Discontinue     06/28/20 1721    Diet - low sodium heart healthy     Discontinue     06/28/20 1721    Discharge instructions     Discontinue    Comments: 1)You have plaque buildup/atherosclerosis of the carotid arteries in your Neck-- Rt side is 60 % and Lt side is > 50%--this increases your risk for stroke and mini strokes so increase Lipitor /atorvastatin to 80 mg daily from 40 mg ---Please take Aspirin 81 mg daily along with Plavix 75 mg daily for 30 days then after that STOP the aspirin and continue ONLY Plavix 75 mg daily indefinitely--for stroke prevention --Please follow-up with neurology when you go back to [place] within the next week or 2--- --Please get copies of your CTA head and CTA neck report before you leave the emergency room today  2)Avoid ibuprofen/Advil/Aleve/Motrin/Goody Powders/Naproxen/BC powders/Meloxicam/Diclofenac/Indomethacin and other Nonsteroidal anti-inflammatory medications as these will make you more likely to bleed and can cause stomach ulcers, can also cause Kidney problems.   3) the CTA imaging study of your neck also reveals a thyroid  nodule--you will need to get a thyroid ultrasound with your regular primary care physician as an outpatient -Your primary care physician may also consider a TSH blood test for your thyroid  4) you apparently had episode of speech disturbance and possible mini stroke according to you and your wife around Mother's Day----given recurrent  episodes of speech disturbance will strongly recommend a 30-day event monitor to make sure you do not have irregular heartbeat condition like atrial fibrillation which can increase your risk for stroke--treatment for atrial fibrillation will be slightly different if you found to have this condition   06/28/20 1721    Diet Carb Modified     Discontinue     06/28/20 1721    Call MD for:  temperature >100.4     Discontinue     06/28/20 1721    Call MD for:  difficulty breathing, headache or visual disturbances     Discontinue     06/28/20 1721    Call MD for:  persistant dizziness or light-headedness     Discontinue     06/28/20 1721    Call MD for:  extreme fatigue     Discontinue     06/28/20 1721           Volanda Napoleon, PA-C 06/28/20 1622    Volanda Napoleon, PA-C 06/28/20 1655    Volanda Napoleon, PA-C 06/28/20 1844    Milton Ferguson, MD 06/29/20 [phone]

## 2020-06-28 NOTE — Discharge Instructions (Signed)
1)You have plaque buildup/atherosclerosis of the carotid arteries in your Neck-- Rt side is 60 % and Lt side is > 50%--this increases your risk for stroke and mini strokes so increase Lipitor /atorvastatin to 80 mg daily from 40 mg ---Please take Aspirin 81 mg daily along with Plavix 75 mg daily for 30 days then after that STOP the aspirin and continue ONLY Plavix 75 mg daily indefinitely--for stroke prevention --Please follow-up with neurology when you go back to [place] within the next week or 2--- --Please get copies of your CTA head and CTA neck report before you leave the emergency room today  2)Avoid ibuprofen/Advil/Aleve/Motrin/Goody Powders/Naproxen/BC powders/Meloxicam/Diclofenac/Indomethacin and other Nonsteroidal anti-inflammatory medications as these will make you more likely to bleed and can cause stomach ulcers, can also cause Kidney problems.   3) the CTA imaging study of your neck also reveals a thyroid nodule--you will need to get a thyroid ultrasound with your regular primary care physician as an outpatient -Your primary care physician may also consider a TSH blood test for your thyroid  4) you apparently had episode of speech disturbance and possible mini stroke according to you and your wife around Mother's Day----given recurrent episodes of speech disturbance will strongly recommend a 30-day event monitor to make sure you do not have irregular heartbeat condition like atrial fibrillation which can increase your risk for stroke--treatment for atrial fibrillation will be slightly different if you found to have this condition

## 2020-06-28 NOTE — Progress Notes (Signed)
Code Stroke Time Documentation   215 Call Time 218 Beeper Time 220 Exam Started  223 Exam Finished 223 Images sent to [facility] 223 Exam completed in [place] radiology called

## 2021-05-16 IMAGING — CT CT HEAD CODE STROKE
3 series · 16 of 47 positions shown, 19 images · non-contrast
Comparison: None.

CLINICAL DATA: Code stroke.  Slurred speech.

EXAM:
CT HEAD WITHOUT CONTRAST
TECHNIQUE: Contiguous axial images were obtained from the base of the skull
through the vertex without intravenous contrast.

[Series 2: head w o · axial · 0.46mm/px · z∈[-176,-26]mm · 10 of 36 slices shown, 13 images]
[im 3/36  brain]
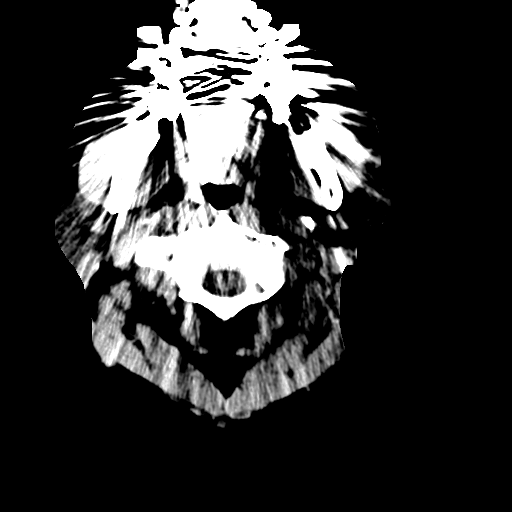
[im 3/36  bone]
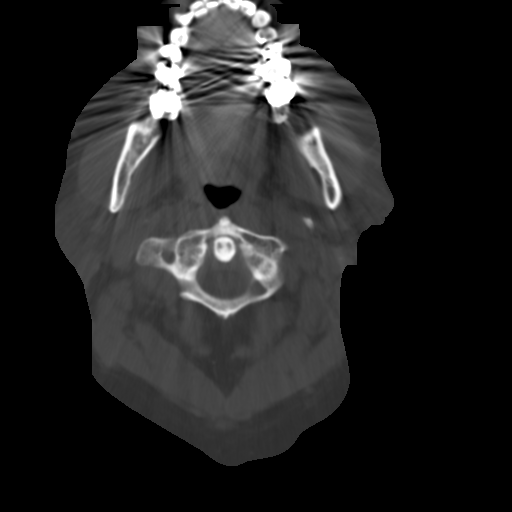
[im 7/36  brain]
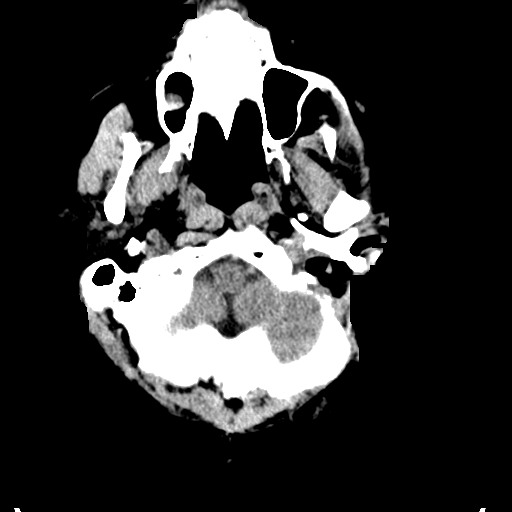
[im 10/36  brain]
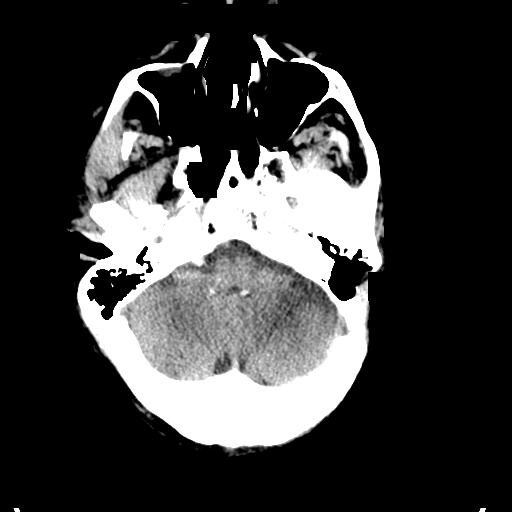
[im 13/36  brain]
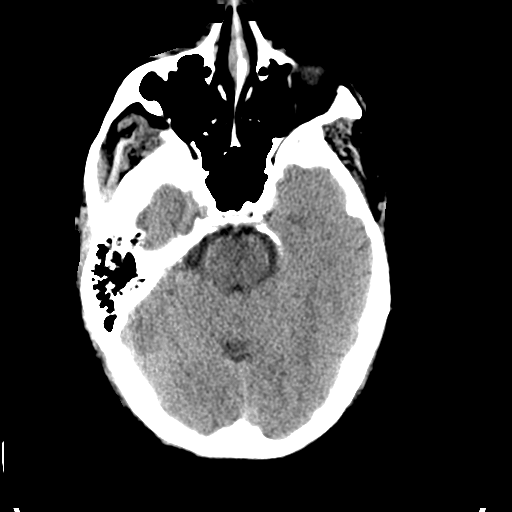
[im 16/36  brain]
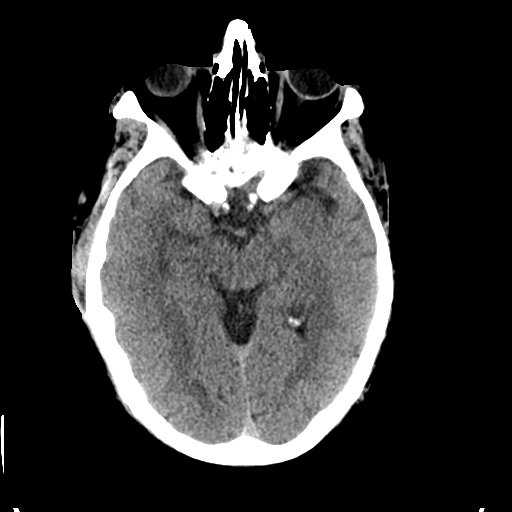
[im 16/36  bone]
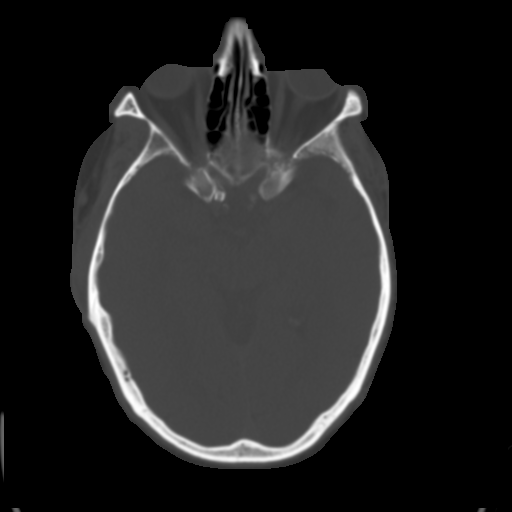
[im 20/36  brain]
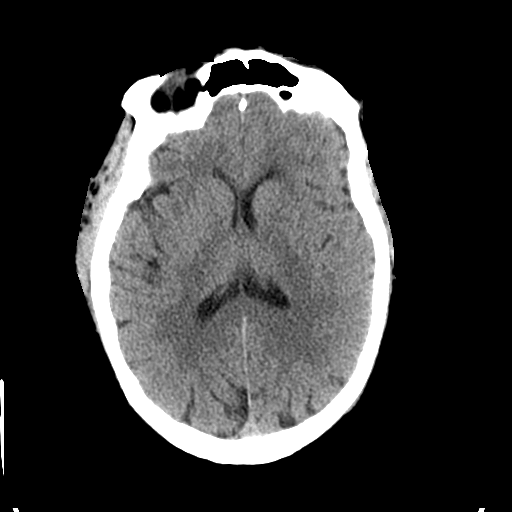
[im 23/36  brain]
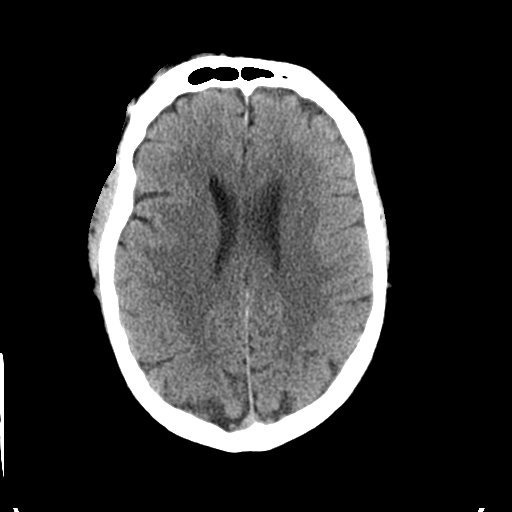
[im 27/36  brain]
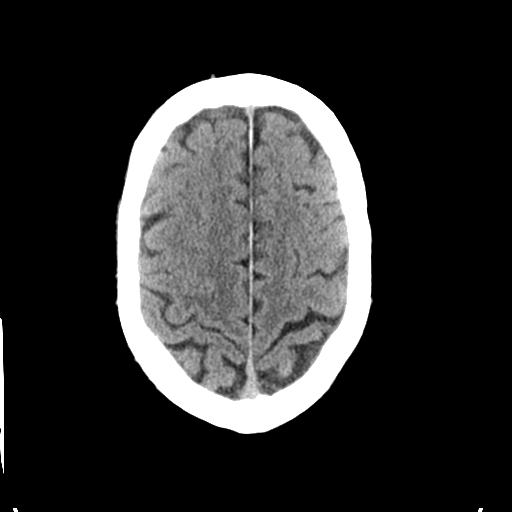
[im 29/36  brain]
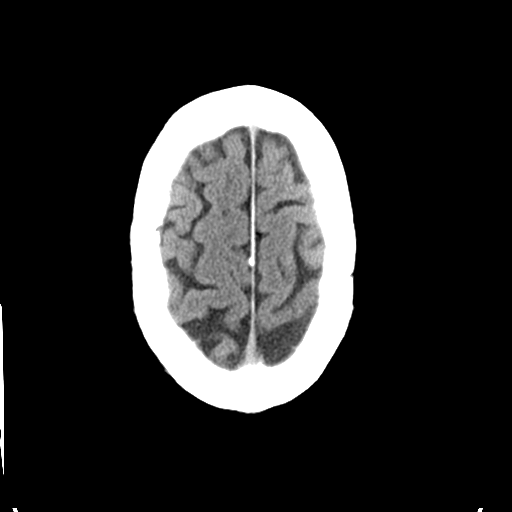
[im 29/36  bone]
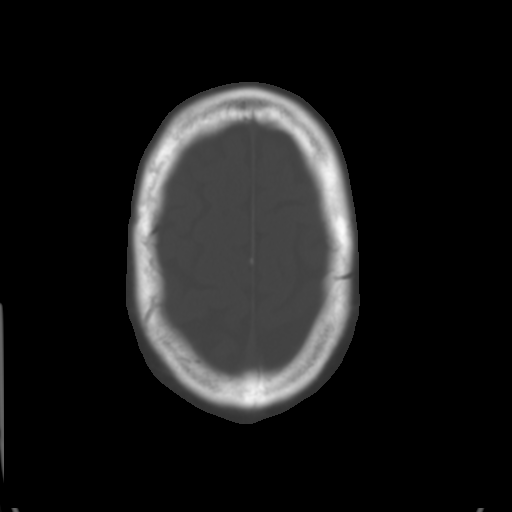
[im 33/36  brain]
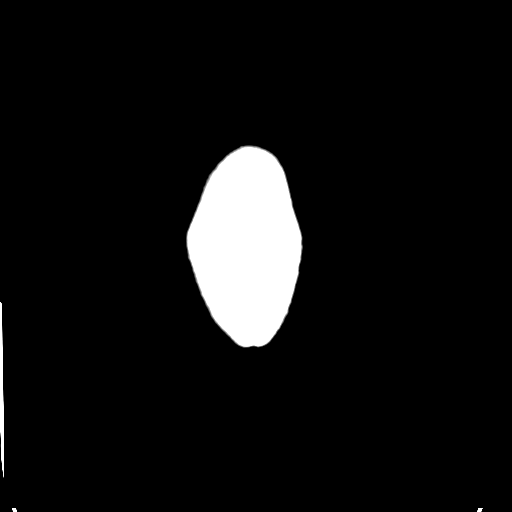

[Series 4: coronal soft · coronal · 0.37mm/px · 3 of 72 slices shown]
[im 24/72  brain]
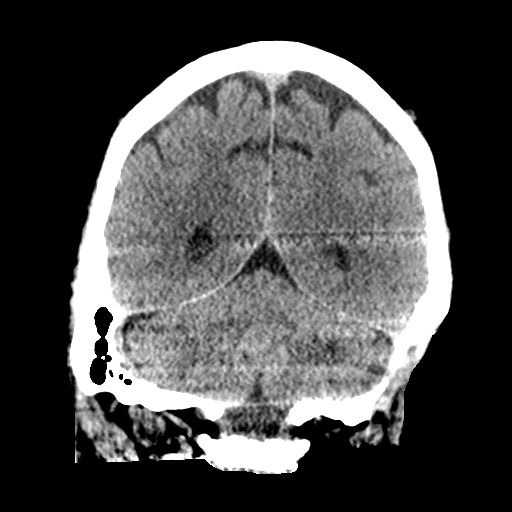
[im 32/72  brain]
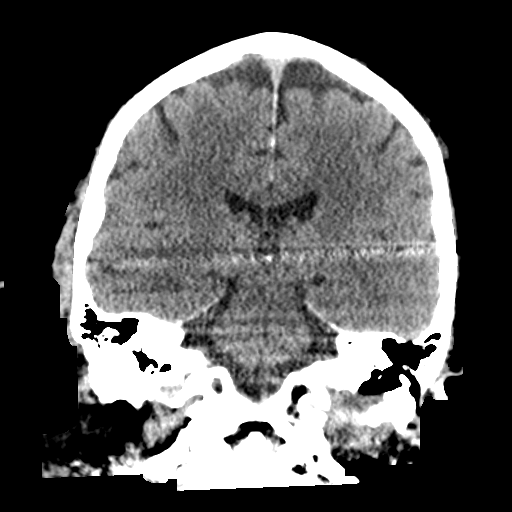
[im 40/72  brain]
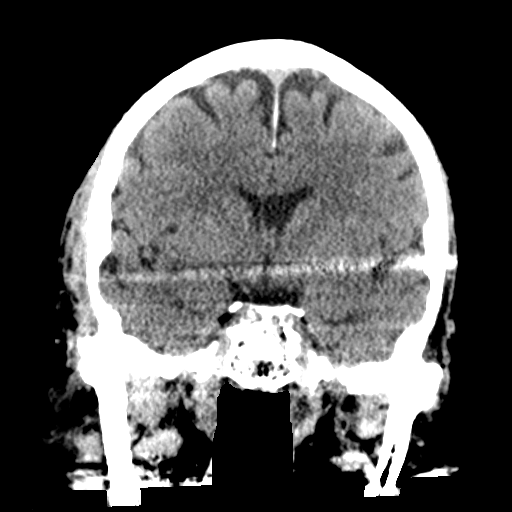

[Series 5: sagittal soft · sagittal · 0.37mm/px · 3 of 60 slices shown]
[im 20/60  brain]
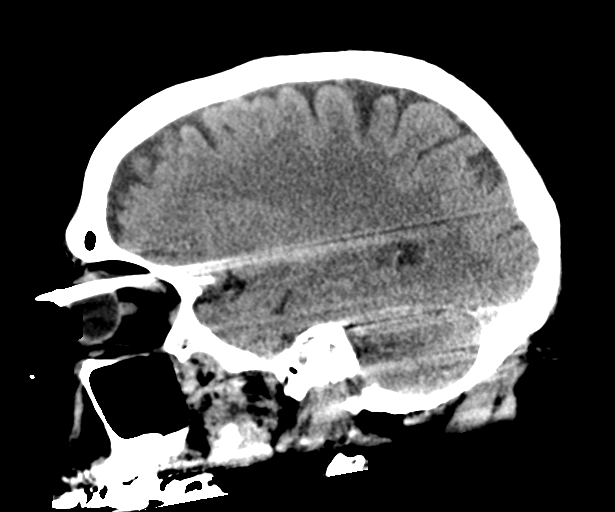
[im 30/60  brain]
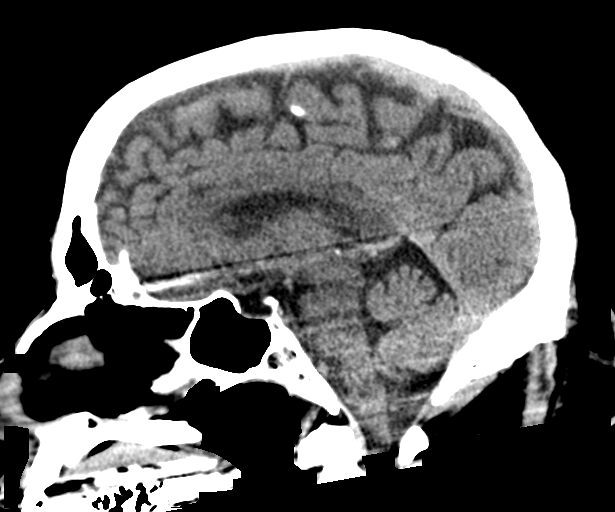
[im 40/60  brain]
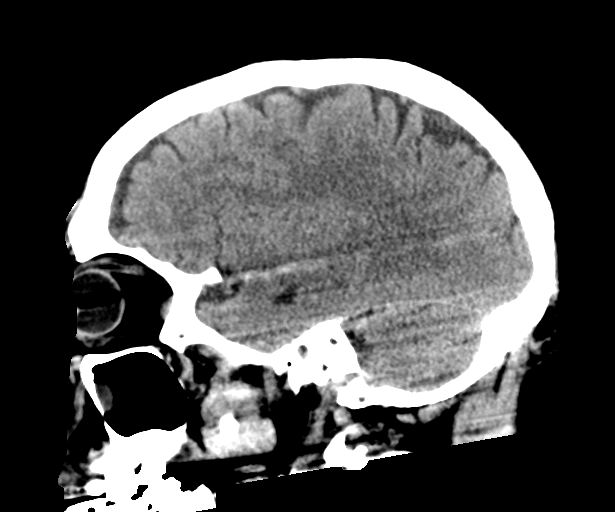

[16 of 47 positions shown; findings below may reference images not displayed]

FINDINGS: Brain: There is no evidence of acute infarct, intracranial
hemorrhage, mass, midline shift, or extra-axial fluid collection.
The ventricles and sulci are normal.

Vascular: Calcified atherosclerosis at the skull base. No hyperdense
vessel.

Skull: No fracture or suspicious osseous lesion.

Sinuses/Orbits: Left canal wall up mastoidectomy. Mild right
maxillary sinus mucosal thickening. Left eyelid weight.

Other: None.

ASPECTS ([place] Stroke Program Early CT Score)

- Ganglionic level infarction (caudate, lentiform nuclei, internal
capsule, insula, M1-M3 cortex): 7

- Supraganglionic infarction (M4-M6 cortex): 3

Total score (0-10 with 10 being normal): 10
IMPRESSION: 1. No evidence of acute intracranial abnormality.
2. ASPECTS is 10.

These results were called by telephone at the time of interpretation
on 06/28/2020 at [DATE] to Dr. Robertsson, who verbally acknowledged
these results.
